# Patient Record
Sex: Female | Born: 2000 | Race: Black or African American | Hispanic: No | Marital: Single | Smoking: Never smoker
Health system: Southern US, Community
[De-identification: ages and names within clinical notes are randomized; demographics above are authoritative.]

## PROBLEM LIST (undated history)

## (undated) DIAGNOSIS — F902 Attention-deficit hyperactivity disorder, combined type: Secondary | ICD-10-CM

## (undated) DIAGNOSIS — F913 Oppositional defiant disorder: Secondary | ICD-10-CM

## (undated) HISTORY — PX: HERNIA REPAIR: SHX51

---
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
## 2024-05-07 ENCOUNTER — Other Ambulatory Visit (HOSPITAL_COMMUNITY): Payer: Self-pay
